# Patient Record
Sex: Female | Born: 1935 | Race: Black or African American | Hispanic: No | Marital: Married | Smoking: Never smoker
Health system: Southern US, Community
[De-identification: ages and names within clinical notes are randomized; demographics above are authoritative.]

## PROBLEM LIST (undated history)

## (undated) DIAGNOSIS — E538 Deficiency of other specified B group vitamins: Secondary | ICD-10-CM

## (undated) DIAGNOSIS — K5790 Diverticulosis of intestine, part unspecified, without perforation or abscess without bleeding: Secondary | ICD-10-CM

## (undated) DIAGNOSIS — F0280 Dementia in other diseases classified elsewhere without behavioral disturbance: Secondary | ICD-10-CM

## (undated) DIAGNOSIS — G309 Alzheimer's disease, unspecified: Secondary | ICD-10-CM

## (undated) DIAGNOSIS — M1990 Unspecified osteoarthritis, unspecified site: Secondary | ICD-10-CM

## (undated) DIAGNOSIS — I10 Essential (primary) hypertension: Secondary | ICD-10-CM

## (undated) DIAGNOSIS — M810 Age-related osteoporosis without current pathological fracture: Secondary | ICD-10-CM

---

## 2004-03-08 ENCOUNTER — Ambulatory Visit: Payer: Self-pay | Admitting: Family Medicine

## 2006-02-21 ENCOUNTER — Ambulatory Visit: Payer: Self-pay | Admitting: Gastroenterology

## 2006-03-19 ENCOUNTER — Ambulatory Visit: Payer: Self-pay | Admitting: Family Medicine

## 2006-09-30 ENCOUNTER — Ambulatory Visit: Payer: Self-pay | Admitting: Rheumatology

## 2006-11-13 ENCOUNTER — Other Ambulatory Visit: Payer: Self-pay

## 2006-11-13 ENCOUNTER — Ambulatory Visit: Payer: Self-pay | Admitting: Unknown Physician Specialty

## 2006-12-16 ENCOUNTER — Inpatient Hospital Stay: Payer: Self-pay | Admitting: Unknown Physician Specialty

## 2008-02-25 ENCOUNTER — Ambulatory Visit: Payer: Self-pay | Admitting: Family Medicine

## 2008-03-02 ENCOUNTER — Inpatient Hospital Stay: Payer: Self-pay | Admitting: Internal Medicine

## 2008-12-30 ENCOUNTER — Emergency Department: Payer: Self-pay | Admitting: Emergency Medicine

## 2009-01-01 ENCOUNTER — Inpatient Hospital Stay: Payer: Self-pay | Admitting: Internal Medicine

## 2009-05-18 ENCOUNTER — Ambulatory Visit: Payer: Self-pay | Admitting: Internal Medicine

## 2009-05-26 ENCOUNTER — Ambulatory Visit: Payer: Self-pay | Admitting: Internal Medicine

## 2009-06-14 ENCOUNTER — Ambulatory Visit: Payer: Self-pay | Admitting: Internal Medicine

## 2009-07-12 ENCOUNTER — Ambulatory Visit: Payer: Self-pay | Admitting: Internal Medicine

## 2009-08-12 ENCOUNTER — Ambulatory Visit: Payer: Self-pay | Admitting: Internal Medicine

## 2010-10-03 ENCOUNTER — Ambulatory Visit: Payer: Self-pay | Admitting: Vascular Surgery

## 2011-09-27 ENCOUNTER — Ambulatory Visit: Payer: Self-pay | Admitting: Family Medicine

## 2014-09-22 DIAGNOSIS — I10 Essential (primary) hypertension: Secondary | ICD-10-CM | POA: Diagnosis not present

## 2015-04-18 DIAGNOSIS — G301 Alzheimer's disease with late onset: Secondary | ICD-10-CM | POA: Diagnosis not present

## 2015-04-18 DIAGNOSIS — I10 Essential (primary) hypertension: Secondary | ICD-10-CM | POA: Diagnosis not present

## 2015-04-18 DIAGNOSIS — F0280 Dementia in other diseases classified elsewhere without behavioral disturbance: Secondary | ICD-10-CM | POA: Diagnosis not present

## 2015-10-17 DIAGNOSIS — F0280 Dementia in other diseases classified elsewhere without behavioral disturbance: Secondary | ICD-10-CM | POA: Diagnosis not present

## 2015-10-17 DIAGNOSIS — I10 Essential (primary) hypertension: Secondary | ICD-10-CM | POA: Diagnosis not present

## 2015-10-17 DIAGNOSIS — G301 Alzheimer's disease with late onset: Secondary | ICD-10-CM | POA: Diagnosis not present

## 2015-12-22 ENCOUNTER — Inpatient Hospital Stay
Admission: EM | Admit: 2015-12-22 | Discharge: 2015-12-24 | DRG: 690 | Disposition: A | Discharge status: Skilled Nursing Facility | Payer: Commercial Managed Care - HMO | Attending: Internal Medicine | Admitting: Internal Medicine

## 2015-12-22 ENCOUNTER — Emergency Department: Payer: Commercial Managed Care - HMO

## 2015-12-22 ENCOUNTER — Encounter: Payer: Self-pay | Admitting: Intensive Care

## 2015-12-22 DIAGNOSIS — Z79899 Other long term (current) drug therapy: Secondary | ICD-10-CM | POA: Diagnosis not present

## 2015-12-22 DIAGNOSIS — Z743 Need for continuous supervision: Secondary | ICD-10-CM | POA: Diagnosis not present

## 2015-12-22 DIAGNOSIS — R531 Weakness: Secondary | ICD-10-CM

## 2015-12-22 DIAGNOSIS — M6282 Rhabdomyolysis: Secondary | ICD-10-CM | POA: Diagnosis present

## 2015-12-22 DIAGNOSIS — I10 Essential (primary) hypertension: Secondary | ICD-10-CM | POA: Diagnosis not present

## 2015-12-22 DIAGNOSIS — M810 Age-related osteoporosis without current pathological fracture: Secondary | ICD-10-CM | POA: Diagnosis not present

## 2015-12-22 DIAGNOSIS — M6281 Muscle weakness (generalized): Secondary | ICD-10-CM | POA: Diagnosis not present

## 2015-12-22 DIAGNOSIS — R4182 Altered mental status, unspecified: Secondary | ICD-10-CM | POA: Diagnosis not present

## 2015-12-22 DIAGNOSIS — N179 Acute kidney failure, unspecified: Secondary | ICD-10-CM | POA: Diagnosis present

## 2015-12-22 DIAGNOSIS — G309 Alzheimer's disease, unspecified: Secondary | ICD-10-CM | POA: Diagnosis not present

## 2015-12-22 DIAGNOSIS — Z741 Need for assistance with personal care: Secondary | ICD-10-CM | POA: Diagnosis not present

## 2015-12-22 DIAGNOSIS — R1312 Dysphagia, oropharyngeal phase: Secondary | ICD-10-CM | POA: Diagnosis not present

## 2015-12-22 DIAGNOSIS — N19 Unspecified kidney failure: Secondary | ICD-10-CM | POA: Diagnosis not present

## 2015-12-22 DIAGNOSIS — N390 Urinary tract infection, site not specified: Principal | ICD-10-CM | POA: Diagnosis present

## 2015-12-22 DIAGNOSIS — R41841 Cognitive communication deficit: Secondary | ICD-10-CM | POA: Diagnosis not present

## 2015-12-22 DIAGNOSIS — E860 Dehydration: Secondary | ICD-10-CM | POA: Diagnosis present

## 2015-12-22 DIAGNOSIS — F0280 Dementia in other diseases classified elsewhere without behavioral disturbance: Secondary | ICD-10-CM | POA: Diagnosis present

## 2015-12-22 DIAGNOSIS — R6889 Other general symptoms and signs: Secondary | ICD-10-CM | POA: Diagnosis not present

## 2015-12-22 HISTORY — DX: Unspecified osteoarthritis, unspecified site: M19.90

## 2015-12-22 HISTORY — DX: Essential (primary) hypertension: I10

## 2015-12-22 HISTORY — DX: Diverticulosis of intestine, part unspecified, without perforation or abscess without bleeding: K57.90

## 2015-12-22 HISTORY — DX: Deficiency of other specified B group vitamins: E53.8

## 2015-12-22 HISTORY — DX: Age-related osteoporosis without current pathological fracture: M81.0

## 2015-12-22 HISTORY — DX: Dementia in other diseases classified elsewhere, unspecified severity, without behavioral disturbance, psychotic disturbance, mood disturbance, and anxiety: F02.80

## 2015-12-22 HISTORY — DX: Alzheimer's disease, unspecified: G30.9

## 2015-12-22 LAB — COMPREHENSIVE METABOLIC PANEL
ALBUMIN: 4.1 g/dL (ref 3.5–5.0)
ALK PHOS: 86 U/L (ref 38–126)
ALT: 19 U/L (ref 14–54)
ANION GAP: 7 (ref 5–15)
AST: 55 U/L — AB (ref 15–41)
BILIRUBIN TOTAL: 0.5 mg/dL (ref 0.3–1.2)
BUN: 41 mg/dL — AB (ref 6–20)
CALCIUM: 9.7 mg/dL (ref 8.9–10.3)
CO2: 27 mmol/L (ref 22–32)
Chloride: 110 mmol/L (ref 101–111)
Creatinine, Ser: 1.22 mg/dL — ABNORMAL HIGH (ref 0.44–1.00)
GFR calc Af Amer: 46 mL/min — ABNORMAL LOW (ref >60)
GFR calc non Af Amer: 40 mL/min — ABNORMAL LOW (ref >60)
GLUCOSE: 111 mg/dL — AB (ref 65–99)
Potassium: 3.9 mmol/L (ref 3.5–5.1)
SODIUM: 144 mmol/L (ref 135–145)
Total Protein: 7.5 g/dL (ref 6.5–8.1)

## 2015-12-22 LAB — URINALYSIS COMPLETE WITH MICROSCOPIC (ARMC ONLY)
BILIRUBIN URINE: NEGATIVE
Glucose, UA: NEGATIVE mg/dL
LEUKOCYTES UA: NEGATIVE
Nitrite: NEGATIVE
PH: 5 (ref 5.0–8.0)
PROTEIN: 100 mg/dL — AB
Specific Gravity, Urine: 1.024 (ref 1.005–1.030)

## 2015-12-22 LAB — CBC WITH DIFFERENTIAL/PLATELET
BASOS ABS: 0 10*3/uL (ref 0–0.1)
Basophils Relative: 1 %
EOS ABS: 0 10*3/uL (ref 0–0.7)
Eosinophils Relative: 0 %
HEMATOCRIT: 35.7 % (ref 35.0–47.0)
Hemoglobin: 12.3 g/dL (ref 12.0–16.0)
Lymphocytes Relative: 13 %
Lymphs Abs: 1.1 10*3/uL (ref 1.0–3.6)
MCH: 27.8 pg (ref 26.0–34.0)
MCHC: 34.6 g/dL (ref 32.0–36.0)
MCV: 80.3 fL (ref 80.0–100.0)
Monocytes Absolute: 0.6 10*3/uL (ref 0.2–0.9)
Monocytes Relative: 7 %
NEUTROS ABS: 6.7 10*3/uL — AB (ref 1.4–6.5)
Neutrophils Relative %: 79 %
Platelets: 265 10*3/uL (ref 150–440)
RBC: 4.44 MIL/uL (ref 3.80–5.20)
RDW: 13.9 % (ref 11.5–14.5)
WBC: 8.4 10*3/uL (ref 3.6–11.0)

## 2015-12-22 LAB — TROPONIN I: TROPONIN I: 0.03 ng/mL — AB (ref <0.03)

## 2015-12-22 LAB — CK: Total CK: 1529 U/L — ABNORMAL HIGH (ref 38–234)

## 2015-12-22 MED ORDER — TRAMADOL HCL 50 MG PO TABS
50.0000 mg | ORAL_TABLET | ORAL | Status: DC
  Administered 2015-12-23 – 2015-12-24 (×2): 50 mg via ORAL
  Filled 2015-12-22 (×2): qty 1

## 2015-12-22 MED ORDER — AMLODIPINE BESYLATE 5 MG PO TABS
5.0000 mg | ORAL_TABLET | Freq: Every day | ORAL | Status: DC
Start: 2015-12-23 — End: 2015-12-24
  Administered 2015-12-23 – 2015-12-24 (×2): 5 mg via ORAL
  Filled 2015-12-22 (×2): qty 1

## 2015-12-22 MED ORDER — METOPROLOL SUCCINATE ER 50 MG PO TB24
50.0000 mg | ORAL_TABLET | Freq: Every day | ORAL | Status: DC
  Administered 2015-12-23 – 2015-12-24 (×2): 50 mg via ORAL
  Filled 2015-12-22 (×2): qty 1

## 2015-12-22 MED ORDER — ACETAMINOPHEN 325 MG PO TABS: 650.0000 mg | ORAL_TABLET | Freq: Four times a day (QID) | ORAL | Status: DC | PRN

## 2015-12-22 MED ORDER — ACETAMINOPHEN 650 MG RE SUPP: 650.0000 mg | Freq: Four times a day (QID) | RECTAL | Status: DC | PRN

## 2015-12-22 MED ORDER — BISACODYL 10 MG RE SUPP: 10.0000 mg | Freq: Every day | RECTAL | Status: DC | PRN

## 2015-12-22 MED ORDER — ENOXAPARIN SODIUM 40 MG/0.4ML ~~LOC~~ SOLN
40.0000 mg | SUBCUTANEOUS | Status: DC
  Administered 2015-12-22 – 2015-12-23 (×2): 40 mg via SUBCUTANEOUS
  Filled 2015-12-22 (×2): qty 0.4

## 2015-12-22 MED ORDER — ONDANSETRON HCL 4 MG PO TABS: 4.0000 mg | ORAL_TABLET | Freq: Four times a day (QID) | ORAL | Status: DC | PRN

## 2015-12-22 MED ORDER — ALBUTEROL SULFATE (2.5 MG/3ML) 0.083% IN NEBU: 2.5000 mg | INHALATION_SOLUTION | RESPIRATORY_TRACT | Status: DC | PRN

## 2015-12-22 MED ORDER — ONDANSETRON HCL 4 MG/2ML IJ SOLN: 4.0000 mg | Freq: Four times a day (QID) | INTRAMUSCULAR | Status: DC | PRN

## 2015-12-22 MED ORDER — SODIUM CHLORIDE 0.9 % IV SOLN: 250.0000 mL | INTRAVENOUS | Status: DC | PRN

## 2015-12-22 MED ORDER — POLYETHYLENE GLYCOL 3350 17 G PO PACK: 17.0000 g | PACK | Freq: Every day | ORAL | Status: DC | PRN

## 2015-12-22 MED ORDER — DEXTROSE 5 % IV SOLN
1.0000 g | Freq: Once | INTRAVENOUS | Status: AC
  Administered 2015-12-22: 1 g via INTRAVENOUS
  Filled 2015-12-22: qty 10

## 2015-12-22 MED ORDER — SODIUM CHLORIDE 0.9% FLUSH: 3.0000 mL | INTRAVENOUS | Status: DC | PRN

## 2015-12-22 MED ORDER — SODIUM CHLORIDE 0.9% FLUSH
3.0000 mL | Freq: Two times a day (BID) | INTRAVENOUS | Status: DC
  Administered 2015-12-23 (×2): 3 mL via INTRAVENOUS

## 2015-12-22 MED ORDER — LISINOPRIL 10 MG PO TABS
10.0000 mg | ORAL_TABLET | Freq: Every day | ORAL | Status: DC
  Administered 2015-12-23 – 2015-12-24 (×2): 10 mg via ORAL
  Filled 2015-12-22 (×2): qty 1

## 2015-12-22 MED ORDER — SODIUM CHLORIDE 0.45 % IV SOLN
INTRAVENOUS | Status: AC
  Administered 2015-12-22: 21:00:00 via INTRAVENOUS

## 2015-12-22 NOTE — ED Provider Notes (Signed)
Time Seen: Approximately 1420  I have reviewed the triage notes  Chief Complaint: Weakness and Altered Mental Status   History of Present Illness: Joanna Small is a 82 y.o. female who presents with recent history of some increasing generalized weakness and difficulty with ambulation. Family states this is occurred over the past week and the family and medical record are the primary historians. The patient's had somewhat altered mental status but has a known history of dementia. The patient may have been left in her wheelchair overnight as her husband hadtrouble getting up and getting her to the bed. The patient herself has again no physical complaints.   History reviewed. No pertinent past medical history.  There are no active problems to display for this patient.   History reviewed. No pertinent surgical history.  History reviewed. No pertinent surgical history.  Current Outpatient Rx  . Order #: [id number] Class: Historical Med  . Order #: [id number] Class: Historical Med  . Order #: [id number] Class: Historical Med  . Order #: [id number] Class: Historical Med  . Order #: [id number] Class: Historical Med    Allergies:  Review of patient's allergies indicates no known allergies.  Family History: History reviewed. No pertinent family history.  Social History: Social History  Substance Use Topics  . Smoking status: Not on file  . Smokeless tobacco: Not on file  . Alcohol use Not on file     Review of Systems:   10 point review of systems was performed and was otherwise negative: History and review of systms mainly acquired through the family and medical record Constitutional: No fever Eyes: No visual disturbances ENT: No sore throat, ear pain Cardiac: No chest pain Respiratory: No shortness of breath, wheezing, or stridor Abdomen: No abdominal pain, no vomiting, No diarrhea Endocrine: No weight loss, No night sweats Extremities: No peripheral edema, cyanosis Skin: No  rashes, easy bruising Neurologic: No focal weakness, trouble with speech or swollowing Urologic: No dysuria, Hematuria, or urinary frequency   Physical Exam:  ED Triage Vitals  Enc Vitals Group     BP 12/22/15 1357 (!) 158/93     Pulse Rate 12/22/15 1357 73     Resp 12/22/15 1357 15     Temp 12/22/15 1357 99 F (37.2 C)     Temp Source 12/22/15 1357 Axillary     SpO2 12/22/15 1357 100 %     Weight 12/22/15 1358 157 lb 12.8 oz (71.6 kg)     Height 12/22/15 1358  (1.702 m)     Head Circumference --      Peak Flow --      Pain Score --      Pain Loc --      Pain Edu? --      Excl. in GC? --     General: Awake , Alert , and Oriented times X1 Head: Normal cephalic , atraumatic Eyes: Pupils equal , round, reactive to light Nose/Throat: No nasal drainage, patent upper airway without erythema or exudate.  Neck: Supple, Full range of motion, No anterior adenopathy or palpable thyroid masses Lungs: Clear to ascultation without wheezes , rhonchi, or rales Heart: Regular rate, regular rhythm without murmurs , gallops , or rubs Abdomen: Soft, non tender without rebound, guarding , or rigidity; bowel sounds positive and symmetric in all 4 quadrants. No organomegaly .        Extremities: 2 plus symmetric pulses. No edema, clubbing or cyanosis Neurologic: patient has minimal effort in both upper and lower extremities Skin:  warm, dry, no rashes   Labs:   All laboratory work was reviewed including any pertinent negatives or positives listed below:  Labs Reviewed  CBC WITH DIFFERENTIAL/PLATELET - Abnormal; Notable for the following:       Result Value   Neutro Abs 6.7 (*)    All other components within normal limits  COMPREHENSIVE METABOLIC PANEL - Abnormal; Notable for the following:    Glucose, Bld 111 (*)    BUN 41 (*)    Creatinine, Ser 1.22 (*)    AST 55 (*)    GFR calc non Af Amer 40 (*)    GFR calc Af Amer 46 (*)    All other components within normal limits  URINALYSIS  COMPLETEWITH MICROSCOPIC ([facility] ONLY) - Abnormal; Notable for the following:    Color, Urine YELLOW (*)    APPearance CLOUDY (*)    Ketones, ur TRACE (*)    Hgb urine dipstick 3+ (*)    Protein, ur 100 (*)    Bacteria, UA MANY (*)    Squamous Epithelial / LPF 0-5 (*)    All other components within normal limits  CK - Abnormal; Notable for the following:    Total CK 1,529 (*)    All other components within normal limits  TROPONIN I - Abnormal; Notable for the following:    Troponin I 0.03 (*)    All other components within normal limits  URINE CULTURE  indings indicate a urinary tract infection  EKG: * ED ECG REPORT I, Jennye MoccasinBrian S Khalee Mazo, the attending physician, personally viewed and interpreted this ECG.  Date: 12/22/2015 EKG Time: 1359Rate: *72 Rhythm: normal sinus rhythm QRS Axis: normal Intervals: normal ST/T Wave abnormalities: normal Conduction Disturbances: none Narrative Interpretation: unremarkable No acute ischemic changes   Radiology:   EXAM: CT HEAD WITHOUT CONTRAST  TECHNIQUE: Contiguous axial images were obtained from the base of the skull through the vertex without intravenous contrast.  COMPARISON:  None.  FINDINGS: Bony calvarium is intact. Diffuse atrophic changes are identified. Mild chronic white matter ischemic change is seen. No findings to suggest acute hemorrhage, acute infarction or space-occupying mass lesion are noted.  IMPRESSION: Chronic Atrophic and ischemic changes without acute abnormality.   Electronically Signed   By: Alcide CleverMark  Lukens M.D.   On: 12/22/2015 14:39    I personally reviewed the radiologic studies     ED Course:   Patient may have some developing rhabdomyolysis from being in the wheelchair overnight.She does have some very mild renal insufficiency.Urine appears to have a urinary tract infection and a urine culture is pending. Patient was started on IV antibiotic therapy. She is not appear to have any  signs of an acute cerebrovascular accident.   Clinical Course     Assessment: Generalized weakness Possible rhabdomyolysis Urinary tract infection   Final Clinical Impression:  Final diagnoses:  Generalized weakness     Plan:  Inpatient mangement            Jennye MoccasinBrian S Caiya Bettes, MD 12/22/15 [phone]

## 2015-12-22 NOTE — H&P (Signed)
[facility] -  at [facility]   PATIENT NAME: Joanna Small    MR#:  [id number]  DATE OF BIRTH:  01/03/1931  DATE OF ADMISSION:  12/22/2015  PRIMARY CARE PHYSICIAN: Rafael Bihari, MD   REQUESTING/REFERRING PHYSICIAN: Dr. Huel Cote  CHIEF COMPLAINT:   Chief Complaint  Patient presents with  . Weakness  . Altered Mental Status    HISTORY OF PRESENT ILLNESS:  Joanna Small  is a 82 y.o. female with a known history of Dementia, hypertension, osteoporosis presented to the emergency room brought in by her daughter due to worsening weakness over the last 2 weeks. At baseline patient is ambulate to the bathroom and back with health. Over the last 2 days she has been able to move from a bed to wheelchair. But since yesterday she is extremely weak and drowsy and was brought to the emergency room. She lives with her husband who is also 22 years old unable to care for her. Here she has been found to have dehydration, rhabdomyolysis and UTI and is being admitted to the hospitalist service. Patient is confused due to dementia and drowsy. History obtained from old records, ER staff and her children at bedside.  PAST MEDICAL HISTORY:   Past Medical History:  Diagnosis Date  . Alzheimer disease   . Arthritis   . B12 deficiency   . Diverticulosis   . Hypertension   . Osteoporosis     PAST SURGICAL HISTORY:  History reviewed. No pertinent surgical history.  SOCIAL HISTORY:   Social History  Substance Use Topics  . Smoking status: Never Smoker  . Smokeless tobacco: Not on file  . Alcohol use Not on file    FAMILY HISTORY:   Family History  Problem Relation Age of Onset  . Cancer Sister     DRUG ALLERGIES:  No Known Allergies  REVIEW OF SYSTEMS:   Review of Systems  Unable to perform ROS: Dementia    MEDICATIONS AT HOME:   Prior to Admission medications   Medication Sig Start Date End Date Taking? Authorizing Provider  acetaminophen  (TYLENOL) 650 MG CR tablet Take 650 mg by mouth every morning.   Yes Historical Provider, MD  amLODipine (NORVASC) 5 MG tablet Take 5 mg by mouth daily.   Yes Historical Provider, MD  lisinopril (PRINIVIL,ZESTRIL) 10 MG tablet Take 10 mg by mouth daily.   Yes Historical Provider, MD  metoprolol succinate (TOPROL-XL) 50 MG 24 hr tablet Take 50 mg by mouth daily.   Yes Historical Provider, MD  traMADol (ULTRAM) 50 MG tablet Take 50 mg by mouth every morning.   Yes Historical Provider, MD     VITAL SIGNS:  Blood pressure (!) 158/93, pulse 73, temperature 98.7 F (37.1 C), temperature source Rectal, resp. rate 15, height  (1.702 m), weight 71.6 kg (157 lb 12.8 oz), SpO2 100 %.  PHYSICAL EXAMINATION:  Physical Exam  GENERAL:  82 y.o.-year-old patient lying in the bed with no acute distress.  EYES: Pupils equal, round, reactive to light and accommodation. No scleral icterus. Extraocular muscles intact.  HEENT: Head atraumatic, normocephalic. Oropharynx and nasopharynx clear. No oropharyngeal erythema, moist oral mucosa  NECK:  Supple, no jugular venous distention. No thyroid enlargement, no tenderness.  LUNGS: Normal breath sounds bilaterally, no wheezing, rales, rhonchi. No use of accessory muscles of respiration.  CARDIOVASCULAR: S1, S2 normal. No murmurs, rubs, or gallops.  ABDOMEN: Soft, nontender, nondistended. Bowel sounds present. No organomegaly or mass.  EXTREMITIES: No  pedal edema, cyanosis, or clubbing. + 2 pedal & radial pulses b/l.   NEUROLOGIC: Moves all 4 extremities. PSYCHIATRIC: The patient is drowsy. Confused  LABORATORY PANEL:   CBC  Recent Labs Lab 12/22/15 1402  WBC 8.4  HGB 12.3  HCT 35.7  PLT 265   ------------------------------------------------------------------------------------------------------------------  Chemistries   Recent Labs Lab 12/22/15 1402  NA 144  K 3.9  CL 110  CO2 27  GLUCOSE 111*  BUN 41*  CREATININE 1.22*  CALCIUM 9.7  AST  55*  ALT 19  ALKPHOS 86  BILITOT 0.5   ------------------------------------------------------------------------------------------------------------------  Cardiac Enzymes  Recent Labs Lab 12/22/15 1402  TROPONINI 0.03*   ------------------------------------------------------------------------------------------------------------------  RADIOLOGY:  Ct Head Wo Contrast  Result Date: 12/22/2015 CLINICAL DATA:  Altered mental status EXAM: CT HEAD WITHOUT CONTRAST TECHNIQUE: Contiguous axial images were obtained from the base of the skull through the vertex without intravenous contrast. COMPARISON:  None. FINDINGS: Bony calvarium is intact. Diffuse atrophic changes are identified. Mild chronic white matter ischemic change is seen. No findings to suggest acute hemorrhage, acute infarction or space-occupying mass lesion are noted. IMPRESSION: Chronic Atrophic and ischemic changes without acute abnormality. Electronically Signed   By: Alcide CleverMark  Lukens M.D.   On: 12/22/2015 14:39     IMPRESSION AND PLAN:   * UTI - this is the likely cause of patient's weakness Start patient on ceftriaxone. Urine cultures sent. Consult physical therapy for weakness. Patient may need assisted living or rehabilitation.  * Rhabdomyolysis CK level at 1500. We will start on IV fluids and repeat levels in the morning. This is secondary to patient being bedbound for the past 2 days.  * Hypertension Continue home medications  * Dementia  * DVT prophylaxis with Lovenox  All the records are reviewed and case discussed with ED provider. Management plans discussed with the patient, family and they are in agreement.  CODE STATUS: DNR  TOTAL TIME TAKING CARE OF THIS PATIENT: 40 minutes.   Milagros LollSudini, Jakyra Kenealy R M.D on 12/22/2015 at 4:25 PM  Between 7am to 6pm - Pager - [phone]  After 6pm go to [email or web address] - password [email or web address]  [facility]  Office  [phone]  CC: Primary care physician;  Rafael BihariWALKER III, JOHN B, MD  Note: This dictation was prepared with Dragon dictation along with smaller phrase technology. Any transcriptional errors that result from this process are unintentional.

## 2015-12-22 NOTE — ED Triage Notes (Signed)
Patient arrived by EMS from home. EMS reported from family that caregivers left patient in wheelchair all night. Family found patient in wheelchair this morning and called EMS due to weakness. Patient was able to stand from wheelchair and pivit to stretcher. Unsure of patients baseline mental status but daughter is on the way. Patient follows some commands but only oriented to self.

## 2015-12-22 NOTE — ED Notes (Signed)
Dr. Huel CoteQuigley notified troponin 0.03

## 2015-12-23 LAB — BASIC METABOLIC PANEL
ANION GAP: 5 (ref 5–15)
BUN: 30 mg/dL — ABNORMAL HIGH (ref 6–20)
CHLORIDE: 110 mmol/L (ref 101–111)
CO2: 28 mmol/L (ref 22–32)
Calcium: 9 mg/dL (ref 8.9–10.3)
Creatinine, Ser: 0.89 mg/dL (ref 0.44–1.00)
GFR, EST NON AFRICAN AMERICAN: 58 mL/min — AB (ref >60)
Glucose, Bld: 99 mg/dL (ref 65–99)
POTASSIUM: 3.5 mmol/L (ref 3.5–5.1)
SODIUM: 143 mmol/L (ref 135–145)

## 2015-12-23 LAB — CBC
HEMATOCRIT: 36.8 % (ref 35.0–47.0)
Hemoglobin: 12 g/dL (ref 12.0–16.0)
MCH: 26.7 pg (ref 26.0–34.0)
MCHC: 32.5 g/dL (ref 32.0–36.0)
MCV: 82.1 fL (ref 80.0–100.0)
Platelets: 276 10*3/uL (ref 150–440)
RBC: 4.48 MIL/uL (ref 3.80–5.20)
RDW: 13.7 % (ref 11.5–14.5)
WBC: 5.6 10*3/uL (ref 3.6–11.0)

## 2015-12-23 LAB — CK: Total CK: 1126 U/L — ABNORMAL HIGH (ref 38–234)

## 2015-12-23 MED ORDER — SODIUM CHLORIDE 0.9 % IV SOLN
INTRAVENOUS | Status: AC
  Administered 2015-12-23: 09:00:00 via INTRAVENOUS

## 2015-12-23 MED ORDER — DEXTROSE 5 % IV SOLN
1.0000 g | INTRAVENOUS | Status: DC
  Administered 2015-12-23: 1 g via INTRAVENOUS
  Filled 2015-12-23 (×3): qty 10

## 2015-12-23 NOTE — Progress Notes (Addendum)
[facility] [place] at [facility]   PATIENT NAME: Joanna Small    MR#:  [id number]  DATE OF BIRTH:  1931/03/27  SUBJECTIVE:    REVIEW OF SYSTEMS:   Review of Systems  Unable to perform ROS: Dementia  Constitutional: Negative for chills, fever and weight loss.  HENT: Negative for ear discharge and ear pain.   Eyes: Negative for blurred vision and pain.  Respiratory: Negative for sputum production and shortness of breath.   Cardiovascular: Negative for chest pain and palpitations.  Gastrointestinal: Negative for abdominal pain, diarrhea, nausea and vomiting.  Genitourinary: Negative for frequency and urgency.  Musculoskeletal: Negative for back pain.  Neurological: Negative for sensory change, speech change and focal weakness.  Psychiatric/Behavioral: Negative for depression and hallucinations.   Tolerating Diet:yes Tolerating PT: Rehab  DRUG ALLERGIES:  No Known Allergies  VITALS:  Blood pressure 104/76, pulse 94, temperature 98.4 F (36.9 C), temperature source Axillary, resp. rate 18, height 5\' 7"  (1.702 m), weight 70.4 kg (155 lb 3.2 oz), SpO2 98 %.  PHYSICAL EXAMINATION:   Physical Exam  GENERAL:  82 y.o.-year-old patient lying in the bed with no acute distress.  EYES: Pupils equal, round, reactive to light and accommodation. No scleral icterus. Extraocular muscles intact.  HEENT: Head atraumatic, normocephalic. Oropharynx and nasopharynx clear.  NECK:  Supple, no jugular venous distention. No thyroid enlargement, no tenderness.  LUNGS: Normal breath sounds bilaterally, no wheezing, rales, rhonchi. No use of accessory muscles of respiration.  CARDIOVASCULAR: S1, S2 normal. No murmurs, rubs, or gallops.  ABDOMEN: Soft, nontender, nondistended. Bowel sounds present. No organomegaly or mass.  EXTREMITIES: No cyanosis, clubbing or edema b/l.    NEUROLOGIC: unable to assess but overall grossly nonfocal PSYCHIATRIC:  Confused , dementia SKIN:  No obvious rash, lesion, or ulcer.   LABORATORY PANEL:  CBC  Recent Labs Lab 12/23/15 1105  WBC 5.6  HGB 12.0  HCT 36.8  PLT 276    Chemistries   Recent Labs Lab 12/22/15 1402 12/23/15 1105  NA 144 143  K 3.9 3.5  CL 110 110  CO2 27 28  GLUCOSE 111* 99  BUN 41* 30*  CREATININE 1.22* 0.89  CALCIUM 9.7 9.0  AST 55*  --   ALT 19  --   ALKPHOS 86  --   BILITOT 0.5  --    Cardiac Enzymes  Recent Labs Lab 12/22/15 1402  TROPONINI 0.03*   RADIOLOGY:  Ct Head Wo Contrast  Result Date: 12/22/2015 CLINICAL DATA:  Altered mental status EXAM: CT HEAD WITHOUT CONTRAST TECHNIQUE: Contiguous axial images were obtained from the base of the skull through the vertex without intravenous contrast. COMPARISON:  None. FINDINGS: Bony calvarium is intact. Diffuse atrophic changes are identified. Mild chronic white matter ischemic change is seen. No findings to suggest acute hemorrhage, acute infarction or space-occupying mass lesion are noted. IMPRESSION: Chronic Atrophic and ischemic changes without acute abnormality. Electronically Signed   By: Alcide CleverMark  Lukens M.D.   On: 12/22/2015 14:39   ASSESSMENT AND PLAN:  Joanna Small  is a 82 y.o. female with a known history of Dementia, hypertension, osteoporosis presented to the emergency room brought in by her daughter due to worsening weakness over the last 2 weeks. At baseline patient is ambulate to the bathroom and back with health. Over the last 2 days she has been able to move from a bed to wheelchair  * UTI - this is the likely cause of patient's weakness  patient on  ceftriaxone. Urine cultures sent. Consult physical therapy for weakness recommends SNT.  * Rhabdomyolysis CK level at 1500. - on IV fluids and repeat levels in the morning. This is secondary to patient being bedbound for the past 2 days.  * Hypertension Continue home medications  * Dementia chronic -per family progressing rapidly  * DVT prophylaxis with  Lovenox  Case discussed with Care Management/Social Worker. Management plans discussed with the patient, family and they are in agreement.  CODE STATUS: DNR  DVT Prophylaxis: lvoenox  TOTAL TIME TAKING CARE OF THIS PATIENT: 30 minutes.  >50% time spent on counselling and coordination of care  POSSIBLE D/C IN 1-2DAYS, DEPENDING ON CLINICAL CONDITION.  Note: This dictation was prepared with Dragon dictation along with smaller phrase technology. Any transcriptional errors that result from this process are unintentional.  Semiah Konczal M.D on 12/23/2015 at 3:42 PM  Between 7am to 6pm - Pager - [phone]  After 6pm go to [email or web address] - password [email or web address] [facility]  [place] [facility]  Office  [phone]  CC: Primary care physician; Rafael Bihari, MD

## 2015-12-23 NOTE — Plan of Care (Signed)
LM for daughter to please call back and help set up password for patient chart.

## 2015-12-23 NOTE — Clinical Social Work Note (Signed)
Clinical Social Work Assessment  Patient Details  Name: Joanna Small MRN: [id number] Date of Birth: 1930-10-08  Date of referral:  12/23/15               Reason for consult:  Facility Placement                Permission sought to share information with:  Chartered certified accountant granted to share information::  Yes, Verbal Permission Granted  Name::      [place]::   [place]   Relationship::     Contact Information:     Housing/Transportation Living arrangements for the past 2 months:  [place] of Information:  Patient, Adult Children Patient Interpreter Needed:  None Criminal Activity/Legal Involvement Pertinent to Current Situation/Hospitalization:  No - Comment as needed Significant Relationships:  Adult Children, Spouse Lives with:  Spouse Do you feel safe going back to the place where you live?  Yes Need for family participation in patient care:  Yes (Comment)  Care giving concerns:  [name] with her husband Jeneen Rinks.    Social Worker assessment / plan:  Holiday representative (CSW) received verbal consult from MD that [name]. Per MD patient may be ready for D/C over the weekend. CSW met with patient and her 2 daughters [place] and Zigmund Daniel were at bedside. CSW introduced self and explained role of CSW department. Patient was pleasantly confused. Per daughters patient lives in [place] with her husband. Daughter Zigmund Daniel lives in [place] and [place] lives in [place]. [place] explained that [name]. [name] are agreeable to SNF search.   FL2 complete and faxed out. CSW presented bed offers. Per daughters they will discuss offers and get back to CSW.    Employment status:  Retired Nurse, adult PT Recommendations:  [place] / Referral to community resources:  [place]  Patient/Family's  Response to care:  [name] are agreeable to [employer].   Patient/Family's Understanding of and Emotional Response to Diagnosis, Current Treatment, and Prognosis:  Daughters were pleasant and thanked CSW for visit.   Emotional Assessment Appearance:  Appears stated age Attitude/Demeanor/Rapport:    Affect (typically observed):  Pleasant Orientation:  Oriented to Self, Oriented to Place, Oriented to  Time, Oriented to Situation Alcohol / Substance use:  Not Applicable Psych involvement (Current and /or in the community):  No (Comment)  Discharge Needs  Concerns to be addressed:  Discharge Planning Concerns Readmission within the last 30 days:  No Current discharge risk:  Dependent with Mobility, Cognitively Impaired Barriers to Discharge:  Continued Medical Work up   [employer], Veronia Beets, LCSW 12/23/2015, 3:30 PM

## 2015-12-23 NOTE — Evaluation (Signed)
Physical Therapy Evaluation Patient Details Name: Joanna Small MRN: [id number] DOB: [birth year] Today's Date: 12/23/2015   History of Present Illness  82 y/o female here with recent progressive weakness neeing w/c the last few days and generally having a hard time doing much for herself.  Pt does have signficant baseline dementia, found to have a UTI.   Clinical Impression  Pt is confused t/o session, but with simple cuing and encouragement from PT/family she was able to do some mobility and participate with PT.  She did get to standing but needed significant assist the entire time to keep from falling back and ultimately was weak and functionally very limited.  Pt will need rehab before she could be safe to return home.    Follow Up Recommendations SNF    Equipment Recommendations   (per ability to use then at rehab)    Recommendations for Other Services       Precautions / Restrictions Precautions Precautions: Fall Restrictions Weight Bearing Restrictions: No      Mobility  Bed Mobility Overal bed mobility: Needs Assistance Bed Mobility: Supine to Sit;Sit to Supine     Supine to sit: Mod assist Sit to supine: Mod assist   General bed mobility comments: Pt is able to initiate getting to EOB with cuing, but does need assist to ultimately get to sitting  Transfers Overall transfer level: Needs assistance Equipment used: None (pt does not normally use one, not aware enough to try today) Transfers: Sit to/from Stand Sit to Stand: Max assist         General transfer comment: Pt leaning backward, appears quite hesitant along with her weakness.  She shows some ability to shift weight forward but needs some assist the entire time to keep from falling back.   Ambulation/Gait             General Gait Details: unable/unsafe to attempt today  Stairs            Wheelchair Mobility    Modified Rankin (Stroke Patients Only)       Balance                                              Pertinent Vitals/Pain Pain Assessment:  (pt does not indicate significant pain, difficult to assess)    Home Living Family/patient expects to be discharged to:: Skilled nursing facility Living Arrangements: Spouse/significant other (children assist)               Additional Comments: Pt's husband has been able to care for her but the task is getting more difficult for them both.    Prior Function Level of Independence: Needs assistance         Comments: apparently until this last week or so pt could walk w/o AD and though she has some confusion was able to do in-home distances, and some basic acts with assist.     Hand Dominance        Extremity/Trunk Assessment   Upper Extremity Assessment: Difficult to assess due to impaired cognition (pt grabbing at IV line, does not participat with testing)           Lower Extremity Assessment: Difficult to assess due to impaired cognition (Pt displays at least 3/5 strength, does appear weak)         Communication   Communication:  (Pt  able to follow basic requests with a lot of time/cuing)  Cognition Arousal/Alertness:  (not alert) Behavior During Therapy: Restless;Anxious;Impulsive Overall Cognitive Status: History of cognitive impairments - at baseline (family reports that she is not too much worse than normal)                      General Comments      Exercises        Assessment/Plan    PT Assessment Patient needs continued PT services  PT Diagnosis Difficulty walking;Generalized weakness;Altered mental status   PT Problem List Decreased strength;Decreased balance;Decreased mobility;Decreased safety awareness;Decreased knowledge of use of DME;Decreased cognition;Decreased activity tolerance  PT Treatment Interventions DME instruction;Gait training;Functional mobility training;Therapeutic activities;Therapeutic exercise;Balance training;Cognitive  remediation;Patient/family education   PT Goals (Current goals can be found in the Care Plan section) Acute Rehab PT Goals Patient Stated Goal: unable to state PT Goal Formulation: Patient unable to participate in goal setting Time For Goal Achievement: 01/06/16 Potential to Achieve Goals: Fair    Frequency Min 2X/week   Barriers to discharge        Co-evaluation               End of Session Equipment Utilized During Treatment: Gait belt Activity Tolerance:  (limited 2/2 mental status) Patient left: with bed alarm set;with family/visitor present;with call bell/phone within reach           Time: 1149-1210 PT Time Calculation (min) (ACUTE ONLY): 21 min   Charges:   PT Evaluation $PT Eval Low Complexity: 1 Procedure     PT G CodesMalachi Pro:        Kermitt Harjo R [place], DPT 12/23/2015, 2:17 PM

## 2015-12-23 NOTE — NC FL2 (Signed)
[place] MEDICAID FL2 LEVEL OF CARE SCREENING TOOL     IDENTIFICATION  Patient Name: Joanna Small Birthdate: Apr 02, 1931 Sex: female Admission Date (Current Location): 12/22/2015  [place] and [place] Number:  Chiropodist and Address:  [facility], [address], [place], [place] [address]      Provider Number: [id number]  Attending Physician Name and Address:  Enedina Finner, MD  Relative Name and Phone Number:       Current Level of Care: Hospital Recommended Level of Care: Skilled Nursing Facility Prior Approval Number:    Date Approved/Denied:   PASRR Number:  [id number])  Discharge Plan: SNF    Current Diagnoses: Patient Active Problem List   Diagnosis Date Noted  . UTI (lower urinary tract infection) 12/22/2015    Orientation RESPIRATION BLADDER Height & Weight     Self  Normal Incontinent Weight: 155 lb 3.2 oz (70.4 kg) Height:   (170.2 cm)  BEHAVIORAL SYMPTOMS/MOOD NEUROLOGICAL BOWEL NUTRITION STATUS   (none )  (none ) Incontinent Diet (Diet: DYS 2 )  AMBULATORY STATUS COMMUNICATION OF NEEDS Skin   Extensive Assist Verbally Normal                       Personal Care Assistance Level of Assistance  Bathing, Feeding, Dressing Bathing Assistance: Limited assistance Feeding assistance: Independent Dressing Assistance: Limited assistance     Functional Limitations Info  Sight, Hearing, Speech Sight Info: Adequate Hearing Info: Adequate Speech Info: Adequate    SPECIAL CARE FACTORS FREQUENCY  PT (By licensed PT)     PT Frequency:  (5)              Contractures      Additional Factors Info  Code Status, Allergies Code Status Info:  (DNR ) Allergies Info:  (No Known Allergies. )           Current Medications (12/23/2015):  This is the current hospital active medication list Current Facility-Administered Medications  Medication Dose Route Frequency Provider Last Rate Last Dose  . 0.9 %   sodium chloride infusion  250 mL Intravenous PRN Srikar Sudini, MD      . 0.9 %  sodium chloride infusion   Intravenous Continuous Enedina Finner, MD 75 mL/hr at 12/23/15 0924    . acetaminophen (TYLENOL) tablet 650 mg  650 mg Oral Q6H PRN Milagros Loll, MD       Or  . acetaminophen (TYLENOL) suppository 650 mg  650 mg Rectal Q6H PRN Srikar Sudini, MD      . albuterol (PROVENTIL) (2.5 MG/3ML) 0.083% nebulizer solution 2.5 mg  2.5 mg Nebulization Q2H PRN Srikar Sudini, MD      . amLODipine (NORVASC) tablet 5 mg  5 mg Oral Daily Milagros Loll, MD   5 mg at 12/23/15 0932  . bisacodyl (DULCOLAX) suppository 10 mg  10 mg Rectal Daily PRN Srikar Sudini, MD      . cefTRIAXone (ROCEPHIN) 1 g in dextrose 5 % 50 mL IVPB  1 g Intravenous Q24H Oralia Manis, MD      . enoxaparin (LOVENOX) injection 40 mg  40 mg Subcutaneous Q24H Milagros Loll, MD   40 mg at 12/22/15 2100  . lisinopril (PRINIVIL,ZESTRIL) tablet 10 mg  10 mg Oral Daily Milagros Loll, MD   10 mg at 12/23/15 0932  . metoprolol succinate (TOPROL-XL) 24 hr tablet 50 mg  50 mg Oral Daily Milagros Loll, MD   50 mg at 12/23/15  0932  . ondansetron (ZOFRAN) tablet 4 mg  4 mg Oral Q6H PRN Milagros LollSrikar Sudini, MD       Or  . ondansetron (ZOFRAN) injection 4 mg  4 mg Intravenous Q6H PRN Srikar Sudini, MD      . polyethylene glycol (MIRALAX / GLYCOLAX) packet 17 g  17 g Oral Daily PRN Srikar Sudini, MD      . sodium chloride flush (NS) 0.9 % injection 3 mL  3 mL Intravenous Q12H Milagros LollSrikar Sudini, MD   3 mL at 12/23/15 0933  . sodium chloride flush (NS) 0.9 % injection 3 mL  3 mL Intravenous PRN Srikar Sudini, MD      . traMADol Janean Sark(ULTRAM) tablet 50 mg  50 mg Oral BH-q7a Milagros LollSrikar Sudini, MD   50 mg at 12/23/15 0932     Discharge Medications: Please see discharge summary for a list of discharge medications.  Relevant Imaging Results:  Relevant Lab Results:   Additional Information  (SSN: [id number])  Tamie Minteer, Darleen CrockerBailey M, LCSW

## 2015-12-23 NOTE — Care Management Important Message (Signed)
Important Message  Patient Details  Name: Pearletha Furllmira J Baria MRN: [id number] Date of Birth: 09-21-1930   Medicare Important Message Given:  Yes    Adonis HugueninBerkhead, Kashten Gowin L, RN 12/23/2015, 9:56 AM

## 2015-12-23 NOTE — Clinical Social Work Placement (Signed)
   CLINICAL SOCIAL WORK PLACEMENT  NOTE  Date:  12/23/2015  Patient Details  Name: Pearletha Furllmira J Livengood MRN: [id number] Date of Birth: 1930-11-01  Clinical Social Work is seeking post-discharge placement for this patient at the Skilled  Nursing Facility level of care (*CSW will initial, date and re-position this form in  chart as items are completed):  Yes   Patient/family provided with [place] Clinical Social Work Department's list of facilities offering this level of care within the geographic area requested by the patient (or if unable, by the patient's family).  Yes   Patient/family informed of their freedom to choose among providers that offer the needed level of care, that participate in Medicare, Medicaid or managed care program needed by the patient, have an available bed and are willing to accept the patient.  Yes   Patient/family informed of [facility]'s ownership interest in [facility] and [facility], as well as of the fact that they are under no obligation to receive care at these facilities.  PASRR submitted to EDS on 12/23/15     PASRR number received on 12/23/15     Existing PASRR number confirmed on       FL2 transmitted to all facilities in geographic area requested by pt/family on 12/23/15     FL2 transmitted to all facilities within larger geographic area on       Patient informed that his/her managed care company has contracts with or will negotiate with certain facilities, including the following:            Patient/family informed of bed offers received.  Patient chooses bed at       Physician recommends and patient chooses bed at      Patient to be transferred to   on  .  Patient to be transferred to facility by       Patient family notified on   of transfer.  Name of family member notified:        PHYSICIAN       Additional Comment:    _______________________________________________ Nary Sneed, Darleen CrockerBailey M, LCSW 12/23/2015, 3:29 PM

## 2015-12-23 NOTE — Progress Notes (Signed)
Clinical Child psychotherapistocial Worker (CSW) presented bed offers. Patient chose [facility]. Per [place] admissions coordinator at [facility] patient can come to room 1A over the weekend. [facility] is aware of above. CSW will continue to follow and assist as needed.   [employer] Hughes Inc[employer]Bailey Nickolaos Brallier, LCSW [phone]

## 2015-12-23 NOTE — Care Management Note (Signed)
Case Management Note  Patient Details  Name: Joanna Small MRN: [id number] Date of Birth: [birth year]  Subjective/Objective:        Spoke with family at the bedside. Patient is from home with husband but has been using a wheelchair more and more has a cane and walker. Patient has the support of adult children. Husband is independent.  Family stated that PT has evaluated patient today and that they said they were going to recommend Rehab. Informed CSW of this. Plan at this time would be for SNF but will continue to follow.            Action/Plan: Anticpated discharge is to SNF.  Expected Discharge Date:  12/24/15               Expected Discharge Plan:  Skilled Nursing Facility  In-House Referral:  Clinical Social Work  Discharge planning Services  CM Consult  Post Acute Care Choice:    Choice offered to:     DME Arranged:    DME Agency:     HH Arranged:    HH Agency:     Status of Service:  In process, will continue to follow  If discussed at Long Length of Stay Meetings, dates discussed:    Additional Comments:  Adonis HugueninBerkhead, Natash Berman L, RN 12/23/2015, 1:57 PM

## 2015-12-24 DIAGNOSIS — E538 Deficiency of other specified B group vitamins: Secondary | ICD-10-CM | POA: Diagnosis not present

## 2015-12-24 DIAGNOSIS — R41841 Cognitive communication deficit: Secondary | ICD-10-CM | POA: Diagnosis not present

## 2015-12-24 DIAGNOSIS — Z743 Need for continuous supervision: Secondary | ICD-10-CM | POA: Diagnosis not present

## 2015-12-24 DIAGNOSIS — Z741 Need for assistance with personal care: Secondary | ICD-10-CM | POA: Diagnosis not present

## 2015-12-24 DIAGNOSIS — M1990 Unspecified osteoarthritis, unspecified site: Secondary | ICD-10-CM | POA: Diagnosis not present

## 2015-12-24 DIAGNOSIS — F0390 Unspecified dementia without behavioral disturbance: Secondary | ICD-10-CM | POA: Diagnosis not present

## 2015-12-24 DIAGNOSIS — R531 Weakness: Secondary | ICD-10-CM | POA: Diagnosis not present

## 2015-12-24 DIAGNOSIS — N19 Unspecified kidney failure: Secondary | ICD-10-CM | POA: Diagnosis not present

## 2015-12-24 DIAGNOSIS — N390 Urinary tract infection, site not specified: Secondary | ICD-10-CM | POA: Diagnosis not present

## 2015-12-24 DIAGNOSIS — G309 Alzheimer's disease, unspecified: Secondary | ICD-10-CM | POA: Diagnosis not present

## 2015-12-24 DIAGNOSIS — R1312 Dysphagia, oropharyngeal phase: Secondary | ICD-10-CM | POA: Diagnosis not present

## 2015-12-24 DIAGNOSIS — R4182 Altered mental status, unspecified: Secondary | ICD-10-CM | POA: Diagnosis not present

## 2015-12-24 DIAGNOSIS — F0280 Dementia in other diseases classified elsewhere without behavioral disturbance: Secondary | ICD-10-CM | POA: Diagnosis not present

## 2015-12-24 DIAGNOSIS — I10 Essential (primary) hypertension: Secondary | ICD-10-CM | POA: Diagnosis not present

## 2015-12-24 DIAGNOSIS — M6281 Muscle weakness (generalized): Secondary | ICD-10-CM | POA: Diagnosis not present

## 2015-12-24 DIAGNOSIS — R6510 Systemic inflammatory response syndrome (SIRS) of non-infectious origin without acute organ dysfunction: Secondary | ICD-10-CM | POA: Diagnosis not present

## 2015-12-24 DIAGNOSIS — M6282 Rhabdomyolysis: Secondary | ICD-10-CM | POA: Diagnosis not present

## 2015-12-24 LAB — URINE CULTURE

## 2015-12-24 LAB — CREATININE, SERUM
CREATININE: 0.87 mg/dL (ref 0.44–1.00)
GFR calc Af Amer: >60 mL/min (ref >60)
GFR, EST NON AFRICAN AMERICAN: 60 mL/min — AB (ref >60)

## 2015-12-24 LAB — CK: CK TOTAL: 591 U/L — AB (ref 38–234)

## 2015-12-24 MED ORDER — BISACODYL 10 MG RE SUPP: 10.0000 mg | Freq: Every day | RECTAL | 0 refills | Status: AC | PRN

## 2015-12-24 MED ORDER — CEPHALEXIN 500 MG PO CAPS
500.0000 mg | ORAL_CAPSULE | Freq: Two times a day (BID) | ORAL | Status: DC
  Administered 2015-12-24: 500 mg via ORAL
  Filled 2015-12-24: qty 1

## 2015-12-24 MED ORDER — POLYETHYLENE GLYCOL 3350 17 G PO PACK: 17.0000 g | PACK | Freq: Every day | ORAL | 0 refills | Status: AC | PRN

## 2015-12-24 MED ORDER — CEPHALEXIN 500 MG PO CAPS: 500.0000 mg | ORAL_CAPSULE | Freq: Two times a day (BID) | ORAL | 0 refills | Status: AC

## 2015-12-24 NOTE — Clinical Social Work Placement (Signed)
   CLINICAL SOCIAL WORK PLACEMENT  NOTE  Date:  12/24/2015  Patient Details  Name: Joanna Small MRN: [id number] Date of Birth: [birth year]  Clinical Social Work is seeking post-discharge placement for this patient at the Skilled  Nursing Facility level of care (*CSW will initial, date and re-position this form in  chart as items are completed):  Yes   Patient/family provided with [place] Clinical Social Work Department's list of facilities offering this level of care within the geographic area requested by the patient (or if unable, by the patient's family).  Yes   Patient/family informed of their freedom to choose among providers that offer the needed level of care, that participate in Medicare, Medicaid or managed care program needed by the patient, have an available bed and are willing to accept the patient.  Yes   Patient/family informed of [place]'s ownership interest in [facility] and [facility], as well as of the fact that they are under no obligation to receive care at these facilities.  PASRR submitted to EDS on 12/23/15     PASRR number received on 12/23/15     Existing PASRR number confirmed on       FL2 transmitted to all facilities in geographic area requested by pt/family on 12/23/15     FL2 transmitted to all facilities within larger geographic area on       Patient informed that his/her managed care company has contracts with or will negotiate with certain facilities, including the following:        Yes   Patient/family informed of bed offers received.  Patient chooses bed at  [facility] )     Physician recommends and patient chooses bed at      Patient to be transferred to  [facility] ) on 12/24/15.  Patient to be transferred to facility by  [facility] )     Patient family notified on 12/24/15 of transfer.  Name of family member notified:   (Patient's daughter [name] is aware of D/C today. )     PHYSICIAN       Additional Comment:    _______________________________________________ [name], Darleen CrockerBailey M, LCSW 12/24/2015, 10:31 AM

## 2015-12-24 NOTE — Discharge Summary (Signed)
[facility] [place] at [facility]   PATIENT NAME: Joanna Small    MR#:  [id number]  DATE OF BIRTH:  [birth year]  DATE OF ADMISSION:  12/22/2015 ADMITTING PHYSICIAN: Milagros Loll, MD  DATE OF DISCHARGE: 12/24/15  PRIMARY CARE PHYSICIAN: Rafael Bihari, MD    ADMISSION DIAGNOSIS:  Generalized weakness [R53.1]  DISCHARGE DIAGNOSIS:  Acute renal failure-resolved Acute mild Rhabdomyolysis UTI Chronic Dementia  SECONDARY DIAGNOSIS:   Past Medical History:  Diagnosis Date  . Alzheimer disease   . Arthritis   . B12 deficiency   . Diverticulosis   . Hypertension   . Osteoporosis     HOSPITAL COURSE:   Joanna Small a 82 y.o.femalewith a known history of Dementia, hypertension, osteoporosis presented to the emergency room brought in by her daughter due to worsening weakness over the last 2 weeks. At baseline patient is ambulate to the bathroom and back with health. Over the last 2 days she has been able to move from a bed to wheelchair  * UTI - this is the likely cause of patient's weakness  patient on ceftriaxone--->change to po keflex  Urine cultures shows mixed bacteria(per lab)  physical therapy  recommends SNT.  * Rhabdomyolysis CK level at 1500---1200--500. - recieved IV fluids  * Hypertension Continue home medications  * Dementia chronic -per family progressing rapidly  * DVT prophylaxis with Lovenox  Overall improving. Eating well. Vitals stable D/c to [facility] D/w family  CONSULTS OBTAINED:    DRUG ALLERGIES:  No Known Allergies  DISCHARGE MEDICATIONS:   Current Discharge Medication List    START taking these medications   Details  bisacodyl (DULCOLAX) 10 MG suppository Place 1 suppository (10 mg total) rectally daily as needed for moderate constipation. Qty: 12 suppository, Refills: 0    cephALEXin (KEFLEX) 500 MG capsule Take 1 capsule (500 mg total) by mouth every 12 (twelve) hours. Qty: 10 capsule,  Refills: 0    polyethylene glycol (MIRALAX / GLYCOLAX) packet Take 17 g by mouth daily as needed for mild constipation. Qty: 14 each, Refills: 0      CONTINUE these medications which have NOT CHANGED   Details  acetaminophen (TYLENOL) 650 MG CR tablet Take 650 mg by mouth every morning.    amLODipine (NORVASC) 5 MG tablet Take 5 mg by mouth daily.    lisinopril (PRINIVIL,ZESTRIL) 10 MG tablet Take 10 mg by mouth daily.    metoprolol succinate (TOPROL-XL) 50 MG 24 hr tablet Take 50 mg by mouth daily.    traMADol (ULTRAM) 50 MG tablet Take 50 mg by mouth every morning.        If you experience worsening of your admission symptoms, develop shortness of breath, life threatening emergency, suicidal or homicidal thoughts you must seek medical attention immediately by calling 911 or calling your MD immediately  if symptoms less severe.  You Must read complete instructions/literature along with all the possible adverse reactions/side effects for all the Medicines you take and that have been prescribed to you. Take any new Medicines after you have completely understood and accept all the possible adverse reactions/side effects.   Please note  You were cared for by a hospitalist during your hospital stay. If you have any questions about your discharge medications or the care you received while you were in the hospital after you are discharged, you can call the unit and asked to speak with the hospitalist on call if the hospitalist that took care of you is not available.  Once you are discharged, your primary care physician will handle any further medical issues. Please note that NO REFILLS for any discharge medications will be authorized once you are discharged, as it is imperative that you return to your primary care physician (or establish a relationship with a primary care physician if you do not have one) for your aftercare needs so that they can reassess your need for medications and monitor  your lab values. Today   SUBJECTIVE  Being fed BF by family. Slept good   VITAL SIGNS:  Blood pressure (!) 149/116, pulse 65, temperature 98.4 F (36.9 C), temperature source Oral, resp. rate 18, height 5\' 7"  (1.702 m), weight 71 kg (156 lb 8 oz), SpO2 99 %.  I/O:   Intake/Output Summary (Last 24 hours) at 12/24/15 0841 Last data filed at 12/24/15 [id number]  Gross per 24 hour  Intake          2566.67 ml  Output                0 ml  Net          2566.67 ml    PHYSICAL EXAMINATION:  GENERAL:  82 y.o.-year-old patient lying in the bed with no acute distress.  EYES: Pupils equal, round, reactive to light and accommodation. No scleral icterus. Extraocular muscles intact.  HEENT: Head atraumatic, normocephalic. Oropharynx and nasopharynx clear.  NECK:  Supple, no jugular venous distention. No thyroid enlargement, no tenderness.  LUNGS: Normal breath sounds bilaterally, no wheezing, rales,rhonchi or crepitation. No use of accessory muscles of respiration.  CARDIOVASCULAR: S1, S2 normal. No murmurs, rubs, or gallops.  ABDOMEN: Soft, non-tender, non-distended. Bowel sounds present. No organomegaly or mass.  EXTREMITIES: No pedal edema, cyanosis, or clubbing.  NEUROLOGIC: unable to assess due to dementia but grossly non focalPSYCHIATRIC: The patient is alert  SKIN: No obvious rash, lesion, or ulcer.   DATA REVIEW:   CBC   Recent Labs Lab 12/23/15 1105  WBC 5.6  HGB 12.0  HCT 36.8  PLT 276    Chemistries   Recent Labs Lab 12/22/15 1402 12/23/15 1105 12/24/15 0347  NA 144 143  --   K 3.9 3.5  --   CL 110 110  --   CO2 27 28  --   GLUCOSE 111* 99  --   BUN 41* 30*  --   CREATININE 1.22* 0.89 0.87  CALCIUM 9.7 9.0  --   AST 55*  --   --   ALT 19  --   --   ALKPHOS 86  --   --   BILITOT 0.5  --   --     Microbiology Results   Recent Results (from the past 240 hour(s))  Urine culture     Status: Abnormal   Collection Time: 12/22/15  2:02 PM  Result Value Ref Range  Status   Specimen Description URINE, CATHETERIZED  Final   Special Requests NONE  Final   Culture MULTIPLE SPECIES PRESENT, SUGGEST RECOLLECTION (A)  Final   Report Status 12/24/2015 FINAL  Final    RADIOLOGY:  Ct Head Wo Contrast  Result Date: 12/22/2015 CLINICAL DATA:  Altered mental status EXAM: CT HEAD WITHOUT CONTRAST TECHNIQUE: Contiguous axial images were obtained from the base of the skull through the vertex without intravenous contrast. COMPARISON:  None. FINDINGS: Bony calvarium is intact. Diffuse atrophic changes are identified. Mild chronic white matter ischemic change is seen. No findings to suggest acute hemorrhage, acute infarction or space-occupying mass lesion  are noted. IMPRESSION: Chronic Atrophic and ischemic changes without acute abnormality. Electronically Signed   By: Alcide Clever M.D.   On: 12/22/2015 14:39     Management plans discussed with the patient, family and they are in agreement.  CODE STATUS:     Code Status Orders        Start     Ordered   12/22/15 1620  Do not attempt resuscitation (DNR)  Continuous    Question Answer Comment  In the event of cardiac or respiratory ARREST Do not call a "code blue"   In the event of cardiac or respiratory ARREST Do not perform Intubation, CPR, defibrillation or ACLS   In the event of cardiac or respiratory ARREST Use medication by any route, position, wound care, and other measures to relive pain and suffering. May use oxygen, suction and manual treatment of airway obstruction as needed for comfort.      12/22/15 1620    Code Status History    Date Active Date Inactive Code Status Order ID Comments User Context   This patient has a current code status but no historical code status.      TOTAL TIME TAKING CARE OF THIS PATIENT: 40 minutes.    Fortunata Betty M.D on 12/24/2015 at 8:41 AM  Between 7am to 6pm - Pager - [phone] After 6pm go to [email or web address] - password [email or web address] [facility]  [facility] [place] [facility]   Office  [phone]  CC: Primary care physician; Rafael Bihari, MD

## 2015-12-24 NOTE — Progress Notes (Signed)
Called report to [place] at [facility]. Patient going to rm 1A. EMS called for transport. VSS at this time

## 2015-12-24 NOTE — Progress Notes (Signed)
Patient is medically stable for D/C to [facility] today. Per [place] admissions coordinator [facility] will [facility] to room 1-A. RN will call report and arrange EMS for transport. [facility]. Auth # [id number]. Clinical Child psychotherapistocial Worker (CSW) sent D/C orders to [facility]. Patient is aware of above. CSW contacted patient's daughter [name] and made her aware of above. Please reconsult if future social work needs arise. CSW signing off.   [employer]Bailey Jeanette Moffatt, LCSW [phone]

## 2016-01-04 DIAGNOSIS — F0280 Dementia in other diseases classified elsewhere without behavioral disturbance: Secondary | ICD-10-CM | POA: Diagnosis not present

## 2016-01-04 DIAGNOSIS — E538 Deficiency of other specified B group vitamins: Secondary | ICD-10-CM | POA: Diagnosis not present

## 2016-01-04 DIAGNOSIS — G309 Alzheimer's disease, unspecified: Secondary | ICD-10-CM | POA: Diagnosis not present

## 2016-01-04 DIAGNOSIS — M1990 Unspecified osteoarthritis, unspecified site: Secondary | ICD-10-CM | POA: Diagnosis not present

## 2016-01-04 DIAGNOSIS — I10 Essential (primary) hypertension: Secondary | ICD-10-CM | POA: Diagnosis not present

## 2016-01-04 DIAGNOSIS — N390 Urinary tract infection, site not specified: Secondary | ICD-10-CM | POA: Diagnosis not present

## 2016-01-20 DIAGNOSIS — G309 Alzheimer's disease, unspecified: Secondary | ICD-10-CM | POA: Diagnosis not present

## 2016-01-20 DIAGNOSIS — M1990 Unspecified osteoarthritis, unspecified site: Secondary | ICD-10-CM | POA: Diagnosis not present

## 2016-01-20 DIAGNOSIS — N390 Urinary tract infection, site not specified: Secondary | ICD-10-CM | POA: Diagnosis not present

## 2016-01-26 DIAGNOSIS — N189 Chronic kidney disease, unspecified: Secondary | ICD-10-CM | POA: Diagnosis not present

## 2016-01-26 DIAGNOSIS — D649 Anemia, unspecified: Secondary | ICD-10-CM | POA: Diagnosis not present

## 2016-02-24 DIAGNOSIS — F0280 Dementia in other diseases classified elsewhere without behavioral disturbance: Secondary | ICD-10-CM | POA: Diagnosis not present

## 2016-02-24 DIAGNOSIS — M1990 Unspecified osteoarthritis, unspecified site: Secondary | ICD-10-CM | POA: Diagnosis not present

## 2016-02-24 DIAGNOSIS — I10 Essential (primary) hypertension: Secondary | ICD-10-CM | POA: Diagnosis not present

## 2016-02-24 DIAGNOSIS — G309 Alzheimer's disease, unspecified: Secondary | ICD-10-CM | POA: Diagnosis not present

## 2016-02-28 DIAGNOSIS — I10 Essential (primary) hypertension: Secondary | ICD-10-CM | POA: Diagnosis not present

## 2016-02-28 DIAGNOSIS — Z5181 Encounter for therapeutic drug level monitoring: Secondary | ICD-10-CM | POA: Diagnosis not present

## 2016-05-22 DIAGNOSIS — G309 Alzheimer's disease, unspecified: Secondary | ICD-10-CM | POA: Diagnosis not present

## 2016-05-22 DIAGNOSIS — F0280 Dementia in other diseases classified elsewhere without behavioral disturbance: Secondary | ICD-10-CM | POA: Diagnosis not present

## 2016-05-22 DIAGNOSIS — I10 Essential (primary) hypertension: Secondary | ICD-10-CM | POA: Diagnosis not present

## 2016-05-22 DIAGNOSIS — M1990 Unspecified osteoarthritis, unspecified site: Secondary | ICD-10-CM | POA: Diagnosis not present

## 2016-06-20 DIAGNOSIS — R1310 Dysphagia, unspecified: Secondary | ICD-10-CM | POA: Diagnosis not present

## 2016-06-20 DIAGNOSIS — G309 Alzheimer's disease, unspecified: Secondary | ICD-10-CM | POA: Diagnosis not present

## 2016-06-20 DIAGNOSIS — M1990 Unspecified osteoarthritis, unspecified site: Secondary | ICD-10-CM | POA: Diagnosis not present

## 2016-06-20 DIAGNOSIS — F0280 Dementia in other diseases classified elsewhere without behavioral disturbance: Secondary | ICD-10-CM | POA: Diagnosis not present

## 2016-06-20 DIAGNOSIS — I10 Essential (primary) hypertension: Secondary | ICD-10-CM | POA: Diagnosis not present

## 2016-07-30 DIAGNOSIS — R1310 Dysphagia, unspecified: Secondary | ICD-10-CM | POA: Diagnosis not present

## 2016-07-30 DIAGNOSIS — I10 Essential (primary) hypertension: Secondary | ICD-10-CM | POA: Diagnosis not present

## 2016-07-30 DIAGNOSIS — M1990 Unspecified osteoarthritis, unspecified site: Secondary | ICD-10-CM | POA: Diagnosis not present

## 2016-07-30 DIAGNOSIS — G309 Alzheimer's disease, unspecified: Secondary | ICD-10-CM | POA: Diagnosis not present

## 2016-08-31 DIAGNOSIS — R1310 Dysphagia, unspecified: Secondary | ICD-10-CM | POA: Diagnosis not present

## 2016-08-31 DIAGNOSIS — F0280 Dementia in other diseases classified elsewhere without behavioral disturbance: Secondary | ICD-10-CM | POA: Diagnosis not present

## 2016-08-31 DIAGNOSIS — G309 Alzheimer's disease, unspecified: Secondary | ICD-10-CM | POA: Diagnosis not present

## 2016-08-31 DIAGNOSIS — I10 Essential (primary) hypertension: Secondary | ICD-10-CM | POA: Diagnosis not present

## 2016-08-31 DIAGNOSIS — M1990 Unspecified osteoarthritis, unspecified site: Secondary | ICD-10-CM | POA: Diagnosis not present

## 2016-10-02 DIAGNOSIS — F0280 Dementia in other diseases classified elsewhere without behavioral disturbance: Secondary | ICD-10-CM | POA: Diagnosis not present

## 2016-10-02 DIAGNOSIS — M1990 Unspecified osteoarthritis, unspecified site: Secondary | ICD-10-CM | POA: Diagnosis not present

## 2016-10-02 DIAGNOSIS — G309 Alzheimer's disease, unspecified: Secondary | ICD-10-CM | POA: Diagnosis not present

## 2016-10-02 DIAGNOSIS — I10 Essential (primary) hypertension: Secondary | ICD-10-CM | POA: Diagnosis not present

## 2016-10-02 DIAGNOSIS — R1310 Dysphagia, unspecified: Secondary | ICD-10-CM | POA: Diagnosis not present

## 2016-11-06 DIAGNOSIS — G309 Alzheimer's disease, unspecified: Secondary | ICD-10-CM | POA: Diagnosis not present

## 2016-11-06 DIAGNOSIS — M1990 Unspecified osteoarthritis, unspecified site: Secondary | ICD-10-CM | POA: Diagnosis not present

## 2016-11-06 DIAGNOSIS — F0280 Dementia in other diseases classified elsewhere without behavioral disturbance: Secondary | ICD-10-CM | POA: Diagnosis not present

## 2016-11-06 DIAGNOSIS — I10 Essential (primary) hypertension: Secondary | ICD-10-CM | POA: Diagnosis not present

## 2016-12-05 DIAGNOSIS — I10 Essential (primary) hypertension: Secondary | ICD-10-CM | POA: Diagnosis not present

## 2016-12-05 DIAGNOSIS — G309 Alzheimer's disease, unspecified: Secondary | ICD-10-CM | POA: Diagnosis not present

## 2016-12-05 DIAGNOSIS — M1990 Unspecified osteoarthritis, unspecified site: Secondary | ICD-10-CM | POA: Diagnosis not present

## 2016-12-05 DIAGNOSIS — F0280 Dementia in other diseases classified elsewhere without behavioral disturbance: Secondary | ICD-10-CM | POA: Diagnosis not present

## 2016-12-24 DIAGNOSIS — F0390 Unspecified dementia without behavioral disturbance: Secondary | ICD-10-CM | POA: Diagnosis not present

## 2016-12-24 DIAGNOSIS — M1990 Unspecified osteoarthritis, unspecified site: Secondary | ICD-10-CM | POA: Diagnosis not present

## 2016-12-24 DIAGNOSIS — I10 Essential (primary) hypertension: Secondary | ICD-10-CM | POA: Diagnosis not present

## 2017-01-14 DIAGNOSIS — F0280 Dementia in other diseases classified elsewhere without behavioral disturbance: Secondary | ICD-10-CM | POA: Diagnosis not present

## 2017-01-14 DIAGNOSIS — G309 Alzheimer's disease, unspecified: Secondary | ICD-10-CM | POA: Diagnosis not present

## 2017-01-14 DIAGNOSIS — M1990 Unspecified osteoarthritis, unspecified site: Secondary | ICD-10-CM | POA: Diagnosis not present

## 2017-01-14 DIAGNOSIS — I10 Essential (primary) hypertension: Secondary | ICD-10-CM | POA: Diagnosis not present

## 2017-02-27 DIAGNOSIS — M1990 Unspecified osteoarthritis, unspecified site: Secondary | ICD-10-CM | POA: Diagnosis not present

## 2017-02-27 DIAGNOSIS — R1310 Dysphagia, unspecified: Secondary | ICD-10-CM | POA: Diagnosis not present

## 2017-02-27 DIAGNOSIS — F0280 Dementia in other diseases classified elsewhere without behavioral disturbance: Secondary | ICD-10-CM | POA: Diagnosis not present

## 2017-02-27 DIAGNOSIS — I10 Essential (primary) hypertension: Secondary | ICD-10-CM | POA: Diagnosis not present

## 2017-02-27 DIAGNOSIS — G309 Alzheimer's disease, unspecified: Secondary | ICD-10-CM | POA: Diagnosis not present

## 2017-04-06 DIAGNOSIS — I10 Essential (primary) hypertension: Secondary | ICD-10-CM | POA: Diagnosis not present

## 2017-04-06 DIAGNOSIS — M1990 Unspecified osteoarthritis, unspecified site: Secondary | ICD-10-CM | POA: Diagnosis not present

## 2017-04-06 DIAGNOSIS — R627 Adult failure to thrive: Secondary | ICD-10-CM | POA: Diagnosis not present

## 2017-04-06 DIAGNOSIS — F0280 Dementia in other diseases classified elsewhere without behavioral disturbance: Secondary | ICD-10-CM | POA: Diagnosis not present

## 2017-04-06 DIAGNOSIS — G309 Alzheimer's disease, unspecified: Secondary | ICD-10-CM | POA: Diagnosis not present

## 2017-04-06 DIAGNOSIS — R1310 Dysphagia, unspecified: Secondary | ICD-10-CM | POA: Diagnosis not present

## 2017-04-08 DIAGNOSIS — G309 Alzheimer's disease, unspecified: Secondary | ICD-10-CM | POA: Diagnosis not present

## 2017-04-08 DIAGNOSIS — M810 Age-related osteoporosis without current pathological fracture: Secondary | ICD-10-CM | POA: Diagnosis not present

## 2017-04-08 DIAGNOSIS — F0280 Dementia in other diseases classified elsewhere without behavioral disturbance: Secondary | ICD-10-CM | POA: Diagnosis not present

## 2017-04-08 DIAGNOSIS — D519 Vitamin B12 deficiency anemia, unspecified: Secondary | ICD-10-CM | POA: Diagnosis not present

## 2017-04-08 DIAGNOSIS — K5900 Constipation, unspecified: Secondary | ICD-10-CM | POA: Diagnosis not present

## 2017-04-08 DIAGNOSIS — M1991 Primary osteoarthritis, unspecified site: Secondary | ICD-10-CM | POA: Diagnosis not present

## 2017-04-08 DIAGNOSIS — K5730 Diverticulosis of large intestine without perforation or abscess without bleeding: Secondary | ICD-10-CM | POA: Diagnosis not present

## 2017-04-08 DIAGNOSIS — R1312 Dysphagia, oropharyngeal phase: Secondary | ICD-10-CM | POA: Diagnosis not present

## 2017-04-08 DIAGNOSIS — I10 Essential (primary) hypertension: Secondary | ICD-10-CM | POA: Diagnosis not present

## 2017-04-13 DEATH — deceased

## 2018-07-28 IMAGING — CT CT HEAD W/O CM
3 of 6 series · 16 of 47 positions shown, 19 images · non-contrast
Comparison: None.

CLINICAL DATA: Altered mental status

EXAM:
CT HEAD WITHOUT CONTRAST
TECHNIQUE: Contiguous axial images were obtained from the base of the skull
through the vertex without intravenous contrast.

[Series 2: head wo · axial · 0.47mm/px · z∈[-87,+43]mm · 10 of 32 slices shown, 13 images]
[im 3/32  brain]
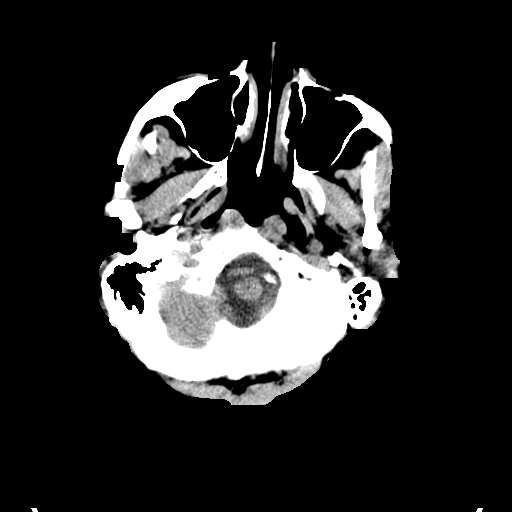
[im 3/32  bone]
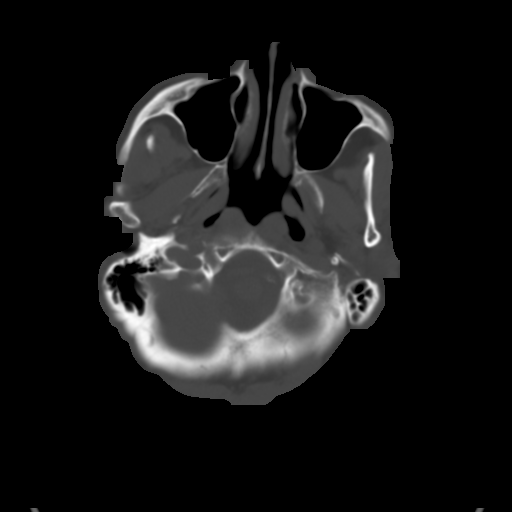
[im 5/32  brain]
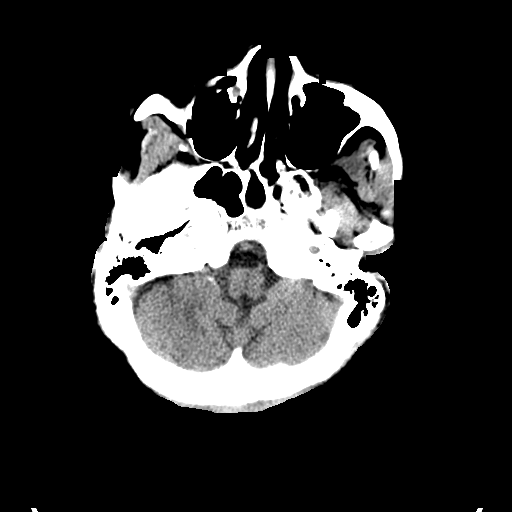
[im 9/32  brain]
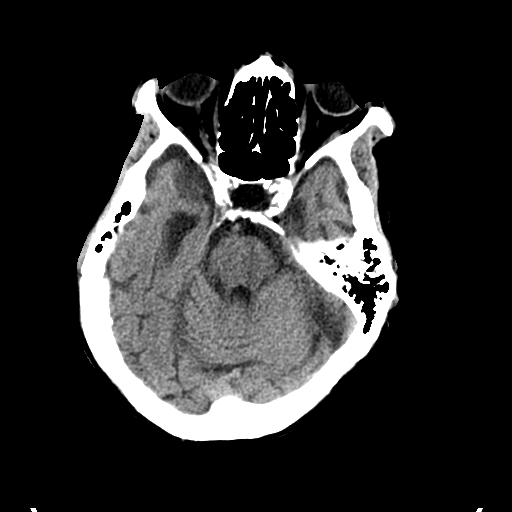
[im 12/32  brain]
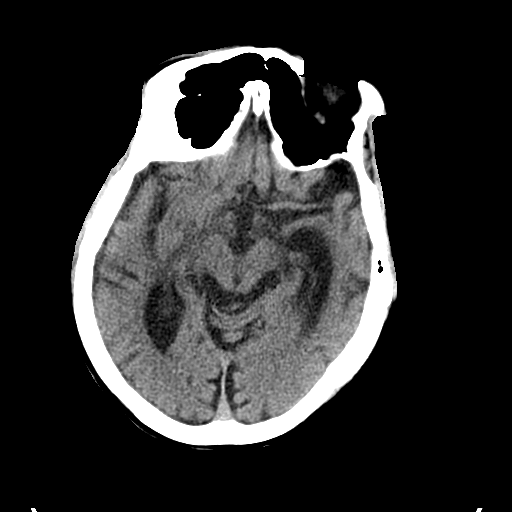
[im 14/32  brain]
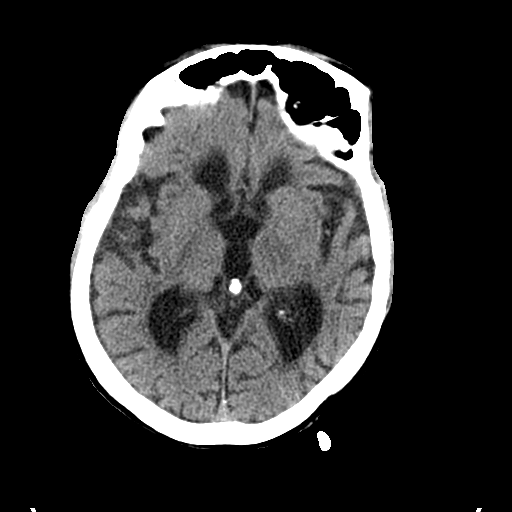
[im 14/32  bone]
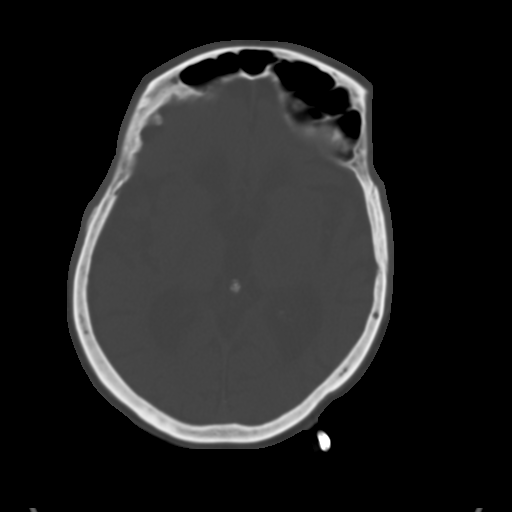
[im 18/32  brain]
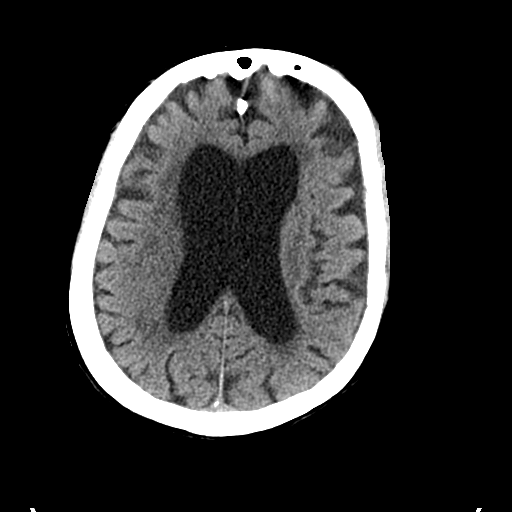
[im 20/32  brain]
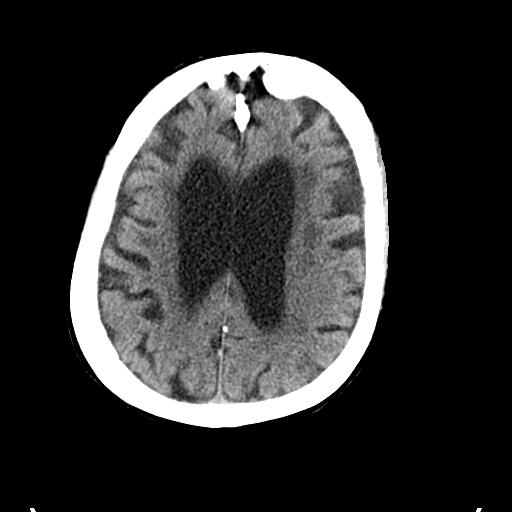
[im 23/32  brain]
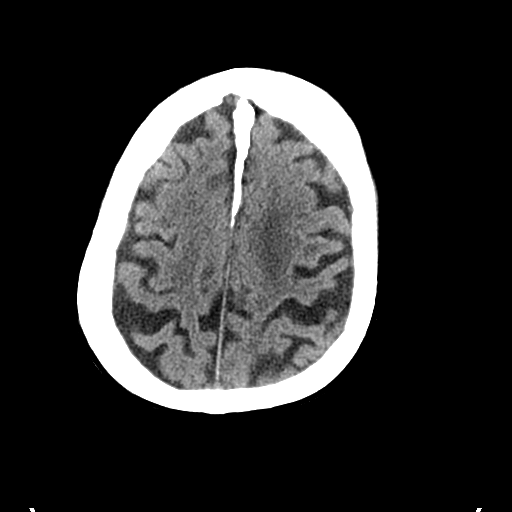
[im 27/32  brain]
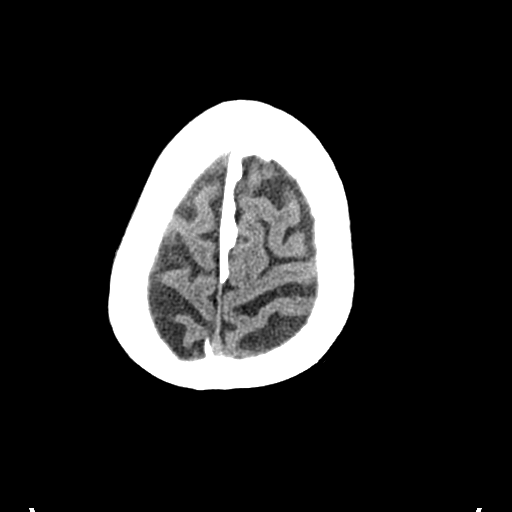
[im 27/32  bone]
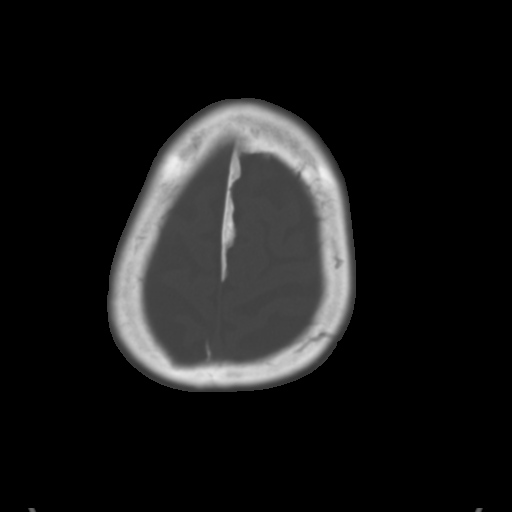
[im 29/32  brain]
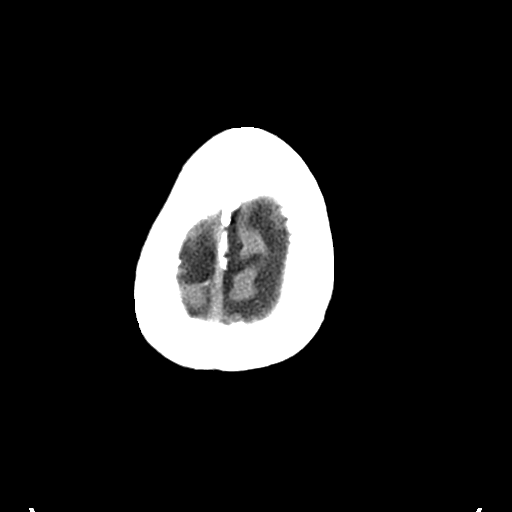

[Series 6: coronal soft tissue · coronal · 0.31mm/px · 3 of 65 slices shown]
[im 17/65  brain]
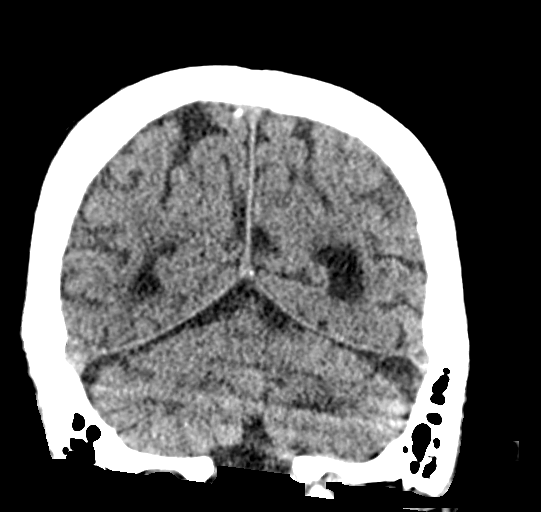
[im 33/65  brain]
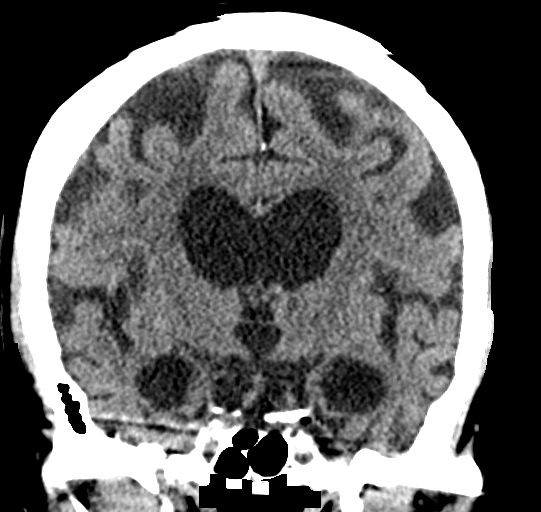
[im 49/65  brain]
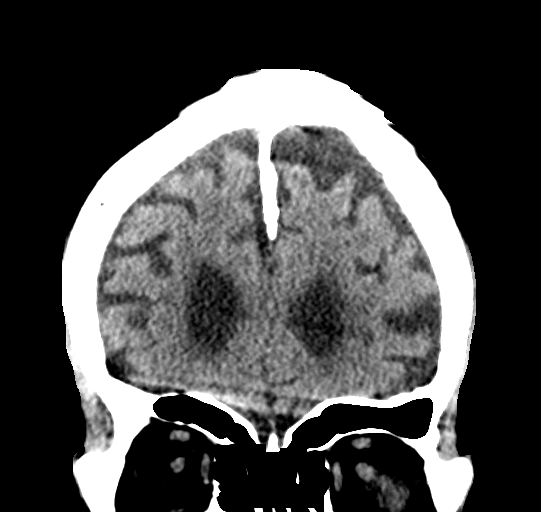

[Series 7: sagittal soft tissue · sagittal · 0.30mm/px · 3 of 53 slices shown]
[im 7/53  brain]
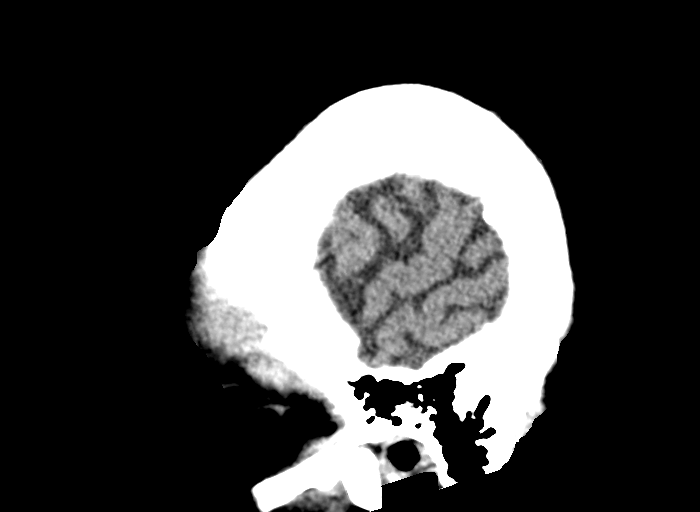
[im 22/53  brain]
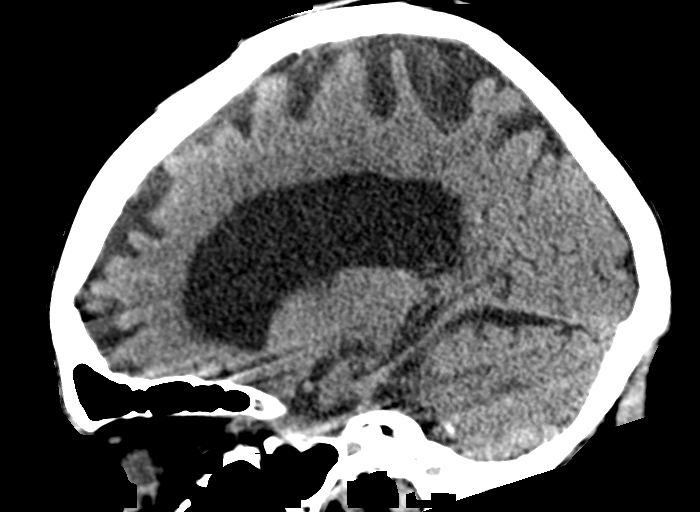
[im 37/53  brain]
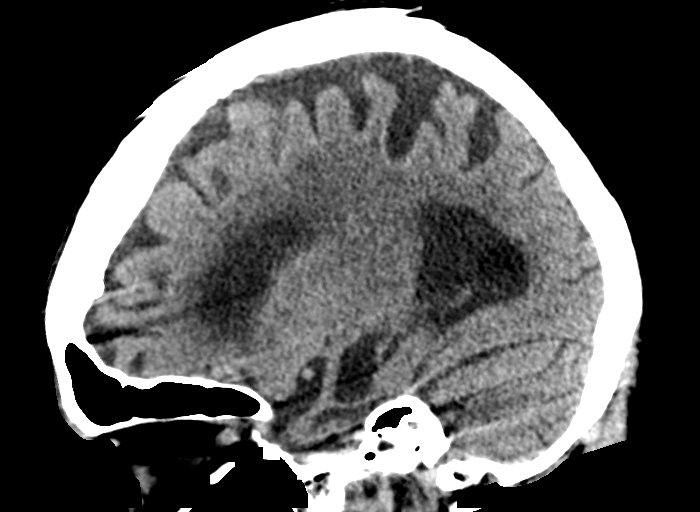

[16 of 47 positions shown; findings below may reference images not displayed]

FINDINGS: Bony calvarium is intact. Diffuse atrophic changes are identified.
Mild chronic white matter ischemic change is seen. No findings to
suggest acute hemorrhage, acute infarction or space-occupying mass
lesion are noted.
IMPRESSION: Chronic Atrophic and ischemic changes without acute abnormality.
